# Patient Record
Sex: Male | Born: 1970 | Race: White | Hispanic: No | Marital: Single | Smoking: Current every day smoker
Health system: Southern US, Community
[De-identification: ages and names within clinical notes are randomized; demographics above are authoritative.]

---

## 2007-03-25 ENCOUNTER — Emergency Department (HOSPITAL_COMMUNITY): Admission: EM | Admit: 2007-03-25 | Discharge: 2007-03-26 | Payer: Self-pay | Admitting: Emergency Medicine

## 2012-08-28 ENCOUNTER — Encounter (HOSPITAL_COMMUNITY): Payer: Self-pay | Admitting: *Deleted

## 2012-08-28 ENCOUNTER — Emergency Department (HOSPITAL_COMMUNITY)
Admission: EM | Admit: 2012-08-28 | Discharge: 2012-08-28 | Disposition: A | Discharge status: Home / Self Care | Payer: Self-pay | Attending: Emergency Medicine | Admitting: Emergency Medicine

## 2012-08-28 DIAGNOSIS — K0889 Other specified disorders of teeth and supporting structures: Secondary | ICD-10-CM

## 2012-08-28 DIAGNOSIS — F17200 Nicotine dependence, unspecified, uncomplicated: Secondary | ICD-10-CM | POA: Insufficient documentation

## 2012-08-28 DIAGNOSIS — K029 Dental caries, unspecified: Secondary | ICD-10-CM

## 2012-08-28 DIAGNOSIS — K089 Disorder of teeth and supporting structures, unspecified: Secondary | ICD-10-CM | POA: Insufficient documentation

## 2012-08-28 MED ORDER — HYDROCODONE-ACETAMINOPHEN 5-325 MG PO TABS
2.0000 | ORAL_TABLET | Freq: Once | ORAL | Status: AC
  Administered 2012-08-28: 2 via ORAL
  Filled 2012-08-28: qty 2

## 2012-08-28 MED ORDER — PENICILLIN V POTASSIUM 500 MG PO TABS: 500.0000 mg | ORAL_TABLET | Freq: Three times a day (TID) | ORAL | Status: DC

## 2012-08-28 MED ORDER — HYDROCODONE-ACETAMINOPHEN 5-325 MG PO TABS: ORAL_TABLET | ORAL | Status: DC

## 2012-08-28 MED ORDER — PENICILLIN V POTASSIUM 250 MG PO TABS
500.0000 mg | ORAL_TABLET | Freq: Once | ORAL | Status: AC
  Administered 2012-08-28: 500 mg via ORAL
  Filled 2012-08-28: qty 2

## 2012-08-28 NOTE — Discharge Instructions (Signed)
 Dental Caries  Tooth decay (dental caries, cavities) is the most common of all oral diseases. It occurs in all ages but is more common in children and young adults.  CAUSES  Bacteria in your mouth combine with foods (particularly sugars and starches) to produce plaque. Plaque is a substance that sticks to the hard surfaces of teeth. The bacteria in the plaque produce acids that attack the enamel of teeth. Repeated acid attacks dissolve the enamel and create holes in the teeth. Root surfaces of teeth may also get these holes.  Other contributing factors include:   Frequent snacking and drinking of cavity-producing foods and liquids.  Poor oral hygiene.  Dry mouth.  Substance abuse such as methamphetamine.  Broken or poor fitting dental restorations.  Eating disorders.  Gastroesophageal reflux disease (GERD).  Certain radiation treatments to the head and neck. SYMPTOMS  At first, dental decay appears as white, chalky areas on the enamel. In this early stage, symptoms are seldom present. As the decay progresses, pits and holes may appear on the enamel surfaces. Progression of the decay will lead to softening of the hard layers of the tooth. At this point you may experience some pain or achy feeling after sweet, hot, or cold foods or drinks are consumed. If left untreated, the decay will reach the internal structures of the tooth and produce severe pain. Extensive dental treatment, such as root canal therapy, may be needed to save the tooth at this late stage of decay development.  DIAGNOSIS  Most cavities will be detected during regular check-ups. A thorough medical and dental history will be taken by the dentist. The dentist will use instruments to check the surfaces of your teeth for any breakdown or discoloration. Some dentists have special instruments, such as lasers, that detect tooth decay. Dental X-rays may also show some cavities that are not visible to the eye (such as between the  contact areas of the teeth). TREATMENT  Treatment involves removal of the tooth decay and replacement with a restorative material such as silver, gold, or composite (white) material. However, if the decay involves a large area of the tooth and there is little remaining healthy tooth structure, a cap (crown) will be fitted over the remaining structure. If the decay involves the center part of the tooth (pulp), root canal treatment will be needed before any type of dental restoration is placed. If the tooth is severely destroyed by the decay process, leaving the remaining tooth structures unrestorable, the tooth will need to be pulled (extracted). Some early tooth decay may be reversed by fluoride treatments and thorough brushing and flossing at home. PREVENTION   Eat healthy foods. Restrict the amount of sugary, starchy foods and liquids you consume. Avoid frequent snacking and drinking of unhealthy foods and liquids.  Sealants can help with prevention of cavities. Sealants are composite resins applied onto the biting surfaces of teeth at risk for decay. They smooth out the pits and grooves and prevent food from being trapped in them. This is done in early childhood before tooth decay has started.  Fluoride tablets may also be prescribed to children between 6 months and 32 years of age if your drinking water is not fluoridated. The fluoride absorbed by the tooth enamel makes teeth less susceptible to decay. Thorough daily cleaning with a toothbrush and dental floss is the best way to prevent cavities. Use of a fluoride toothpaste is highly recommended. Fluoride mouth rinses may be used in specific cases.  Topical application of  fluoride by your dentist is important in children.  Regular visits with a dentist for checkups and cleanings are also important. SEEK IMMEDIATE DENTAL CARE IF:  You have a fever.  You develop redness and swelling of your face, jaw, or neck.  You develop swelling around a  tooth.  You are unable to open your mouth or cannot swallow.  You have severe pain uncontrolled by pain medicine. Document Released: 06/02/2002 Document Revised: 12/03/2011 Document Reviewed: 02/15/2011 [facility] Patient Information 2013 [place], [place].    Narcotic and benzodiazepine use may cause drowsiness, slowed breathing or dependence.  Please use with caution and do not drive, operate machinery or watch young children alone while taking them.  Taking combinations of these medications or drinking alcohol will potentiate these effects.

## 2012-08-28 NOTE — ED Provider Notes (Signed)
History   This chart was scribed for Gavin Pound. Oletta Lamas, MD by Sofie Rower, ED Scribe. The patient was seen in room TR04C/TR04C and the patient's care was started at 3:16PM.     CSN: [id number]  Arrival date & time 08/28/12  1501   First MD Initiated Contact with Patient 08/28/12 1516      Chief Complaint  Patient presents with  . Dental Pain    (Consider location/radiation/quality/duration/timing/severity/associated sxs/prior treatment) The history is provided by the patient. No language interpreter was used.    Todd Carter is a 41 y.o. male , who presents to the Emergency Department complaining of  gradual, progressively worsening, dental pain, located at the right upper jaw, onset two days ago (08/26/12). The pt reports he has experienced minor dental discomfort for the past two months, however, his dental pain has increased severely over the past two days, prompting his visit to [facility] today. The pt has applied Orajel which does not provide relief of the dental pain.  History reviewed. No pertinent past medical history.  History reviewed. No pertinent past surgical history.  No family history on file.  History  Substance Use Topics  . Smoking status: Current Every Day Smoker  . Smokeless tobacco: Not on file  . Alcohol Use: Yes      Review of Systems  Constitutional: Negative for fever.  HENT: Positive for dental problem. Negative for ear pain and trouble swallowing.     Allergies  Review of patient's allergies indicates no known allergies.  Home Medications   Current Outpatient Rx  Name  Route  Sig  Dispense  Refill  . BENZOCAINE 10 % MT GEL   Mouth/Throat   Use as directed 1 application in the mouth or throat as needed. As needed for toothache.         . IBUPROFEN 200 MG PO TABS   Oral   Take 800 mg by mouth every 4 (four) hours as needed. As needed for toothache.         Marland Kitchen HYDROCODONE-ACETAMINOPHEN 5-325 MG PO TABS      1-2 tablets po q 6 hours prn  moderate to severe pain   20 tablet   0   . PENICILLIN V POTASSIUM 500 MG PO TABS   Oral   Take 1 tablet (500 mg total) by mouth 3 (three) times daily.   30 tablet   0     BP 111/80  Pulse 66  Temp 98 F (36.7 C) (Oral)  Resp 16  Ht 5\' 8"  (1.727 m)  Wt 150 lb (68.04 kg)  BMI 22.81 kg/m2  SpO2 98%  Physical Exam  Nursing note and vitals reviewed. Constitutional: He appears well-developed and well-nourished. No distress.  HENT:  Head: Atraumatic.  Nose: Nose normal.       2nd upper molar on right partially crack, few caries noted.   Neck: Normal range of motion.  Pulmonary/Chest: Effort normal.  Neurological: He is alert.  Skin: Skin is warm and dry. He is not diaphoretic.    ED Course  Procedures (including critical care time)  DIAGNOSTIC STUDIES: Oxygen Saturation is 98% on room air, normal by my interpretation.    COORDINATION OF CARE:   3:19 PM- Treatment plan concerning pain management, antibiotics and follow up with dentist discussed with patient. Pt agrees with treatment.     Labs Reviewed - No data to display No results found.   1. Dental caries   2. Pain, dental  No trismus, airway compromise.  Pt given oral analgesics and PCN.  Referral given to Dr. Leanord Asal.  Instructions on quick referral and to decrease smoking encouragement given.    MDM  I personally performed the services described in this documentation, which was scribed in my presence. The recorded information has been reviewed and is accurate.     Gavin Pound. Jarious Lyon, MD 08/28/12 1526

## 2012-08-28 NOTE — ED Notes (Signed)
The pt has had a toothache for weeks worse for 48 houra

## 2012-08-28 NOTE — ED Notes (Signed)
Pt c/o dental pain to right upper back tooth. Pt reports he hasn't been to see a dentist.

## 2012-11-17 ENCOUNTER — Emergency Department (HOSPITAL_COMMUNITY)
Admission: EM | Admit: 2012-11-17 | Discharge: 2012-11-17 | Disposition: A | Discharge status: Home / Self Care | Payer: Self-pay | Attending: Emergency Medicine | Admitting: Emergency Medicine

## 2012-11-17 ENCOUNTER — Emergency Department (HOSPITAL_COMMUNITY): Payer: Self-pay

## 2012-11-17 ENCOUNTER — Encounter (HOSPITAL_COMMUNITY): Payer: Self-pay | Admitting: *Deleted

## 2012-11-17 DIAGNOSIS — F17200 Nicotine dependence, unspecified, uncomplicated: Secondary | ICD-10-CM | POA: Insufficient documentation

## 2012-11-17 DIAGNOSIS — N509 Disorder of male genital organs, unspecified: Secondary | ICD-10-CM | POA: Insufficient documentation

## 2012-11-17 DIAGNOSIS — N453 Epididymo-orchitis: Secondary | ICD-10-CM | POA: Insufficient documentation

## 2012-11-17 DIAGNOSIS — N451 Epididymitis: Secondary | ICD-10-CM

## 2012-11-17 LAB — URINALYSIS, ROUTINE W REFLEX MICROSCOPIC
Glucose, UA: NEGATIVE mg/dL
Hgb urine dipstick: NEGATIVE
Leukocytes, UA: NEGATIVE
Specific Gravity, Urine: 1.025 (ref 1.005–1.030)
pH: 6 (ref 5.0–8.0)

## 2012-11-17 LAB — CBC WITH DIFFERENTIAL/PLATELET
Eosinophils Relative: 2 % (ref 0–5)
Lymphocytes Relative: 13 % (ref 12–46)
Lymphs Abs: 1.4 10*3/uL (ref 0.7–4.0)
MCV: 87.9 fL (ref 78.0–100.0)
Neutro Abs: 8.2 10*3/uL — ABNORMAL HIGH (ref 1.7–7.7)
Platelets: 198 10*3/uL (ref 150–400)
RBC: 4.48 MIL/uL (ref 4.22–5.81)
WBC: 10.6 10*3/uL — ABNORMAL HIGH (ref 4.0–10.5)

## 2012-11-17 LAB — BASIC METABOLIC PANEL
CO2: 25 mEq/L (ref 19–32)
Calcium: 9.6 mg/dL (ref 8.4–10.5)
Chloride: 101 mEq/L (ref 96–112)
Glucose, Bld: 99 mg/dL (ref 70–99)
Potassium: 4 mEq/L (ref 3.5–5.1)
Sodium: 136 mEq/L (ref 135–145)

## 2012-11-17 MED ORDER — OXYCODONE-ACETAMINOPHEN 5-325 MG PO TABS
2.0000 | ORAL_TABLET | Freq: Once | ORAL | Status: AC
  Administered 2012-11-17: 2 via ORAL
  Filled 2012-11-17: qty 2

## 2012-11-17 MED ORDER — OXYCODONE-ACETAMINOPHEN 5-325 MG PO TABS: 2.0000 | ORAL_TABLET | ORAL | Status: DC | PRN

## 2012-11-17 MED ORDER — CIPROFLOXACIN HCL 500 MG PO TABS: 500.0000 mg | ORAL_TABLET | Freq: Two times a day (BID) | ORAL | Status: DC

## 2012-11-17 NOTE — ED Notes (Signed)
Pt states right testicle swelling since yesterday

## 2012-11-17 NOTE — ED Provider Notes (Signed)
History     CSN: [id number]  Arrival date & time 11/17/12  1048   First MD Initiated Contact with Patient 11/17/12 1222      Chief Complaint  Patient presents with  . Groin Swelling    (Consider location/radiation/quality/duration/timing/severity/associated sxs/prior treatment) The history is provided by the patient.   patient here with right scrotal pain x24 hours. Pain worse with walking and characterized as sharp. Denies any dysuria or hematuria. Prior history of same. Denies any prior history of trauma Using Motrin without relief.  History reviewed. No pertinent past medical history.  History reviewed. No pertinent past surgical history.  No family history on file.  History  Substance Use Topics  . Smoking status: Current Every Day Smoker  . Smokeless tobacco: Not on file  . Alcohol Use: Yes      Review of Systems  All other systems reviewed and are negative.    Allergies  Review of patient's allergies indicates no known allergies.  Home Medications   Current Outpatient Rx  Name  Route  Sig  Dispense  Refill  . ibuprofen (ADVIL,MOTRIN) 200 MG tablet   Oral   Take 800 mg by mouth every 4 (four) hours as needed. As needed for toothache.           BP 119/71  Pulse 91  Temp(Src) 98.2 F (36.8 C) (Oral)  Resp 18  SpO2 98%  Physical Exam  Nursing note and vitals reviewed. Constitutional: He is oriented to person, place, and time. He appears well-developed and well-nourished.  Non-toxic appearance. No distress.  HENT:  Head: Normocephalic and atraumatic.  Eyes: Conjunctivae, EOM and lids are normal. Pupils are equal, round, and reactive to light.  Neck: Normal range of motion. Neck supple. No tracheal deviation present. No mass present.  Cardiovascular: Normal rate, regular rhythm and normal heart sounds.  Exam reveals no gallop.   No murmur heard. Pulmonary/Chest: Effort normal and breath sounds normal. No stridor. No respiratory distress. He has no  decreased breath sounds. He has no wheezes. He has no rhonchi. He has no rales.  Abdominal: Soft. Normal appearance and bowel sounds are normal. He exhibits no distension. There is no tenderness. There is no rebound and no CVA tenderness.  Genitourinary: Right testis shows swelling and tenderness. Circumcised.  Musculoskeletal: Normal range of motion. He exhibits no edema and no tenderness.  Neurological: He is alert and oriented to person, place, and time. He has normal strength. No cranial nerve deficit or sensory deficit. GCS eye subscore is 4. GCS verbal subscore is 5. GCS motor subscore is 6.  Skin: Skin is warm and dry. No abrasion and no rash noted.  Psychiatric: He has a normal mood and affect. His speech is normal and behavior is normal.    ED Course  Procedures (including critical care time)  Labs Reviewed  CBC WITH DIFFERENTIAL - Abnormal; Notable for the following:    WBC 10.6 (*)    Neutro Abs 8.2 (*)    All other components within normal limits  URINE CULTURE  BASIC METABOLIC PANEL  URINALYSIS, ROUTINE W REFLEX MICROSCOPIC   No results found.   No diagnosis found.    MDM  Pt to be treated for epididymitis        Toy Baker, MD 11/17/12 1421

## 2012-11-17 NOTE — ED Notes (Signed)
Pt reports right lower abdominal pain and lower back pain

## 2012-11-18 LAB — URINE CULTURE
Colony Count: NO GROWTH
Culture: NO GROWTH

## 2013-08-24 ENCOUNTER — Emergency Department (HOSPITAL_COMMUNITY): Payer: Self-pay

## 2013-08-24 ENCOUNTER — Emergency Department (HOSPITAL_COMMUNITY)
Admission: EM | Admit: 2013-08-24 | Discharge: 2013-08-24 | Disposition: A | Discharge status: Home / Self Care | Payer: Self-pay | Attending: Emergency Medicine | Admitting: Emergency Medicine

## 2013-08-24 ENCOUNTER — Encounter (HOSPITAL_COMMUNITY): Payer: Self-pay | Admitting: Emergency Medicine

## 2013-08-24 DIAGNOSIS — Y9389 Activity, other specified: Secondary | ICD-10-CM | POA: Insufficient documentation

## 2013-08-24 DIAGNOSIS — S43101A Unspecified dislocation of right acromioclavicular joint, initial encounter: Secondary | ICD-10-CM

## 2013-08-24 DIAGNOSIS — S43109A Unspecified dislocation of unspecified acromioclavicular joint, initial encounter: Secondary | ICD-10-CM | POA: Insufficient documentation

## 2013-08-24 DIAGNOSIS — F17200 Nicotine dependence, unspecified, uncomplicated: Secondary | ICD-10-CM | POA: Insufficient documentation

## 2013-08-24 DIAGNOSIS — W138XXA Fall from, out of or through other building or structure, initial encounter: Secondary | ICD-10-CM | POA: Insufficient documentation

## 2013-08-24 DIAGNOSIS — S298XXA Other specified injuries of thorax, initial encounter: Secondary | ICD-10-CM | POA: Insufficient documentation

## 2013-08-24 DIAGNOSIS — Y92009 Unspecified place in unspecified non-institutional (private) residence as the place of occurrence of the external cause: Secondary | ICD-10-CM | POA: Insufficient documentation

## 2013-08-24 MED ORDER — HYDROCODONE-ACETAMINOPHEN 5-325 MG PO TABS
2.0000 | ORAL_TABLET | Freq: Once | ORAL | Status: AC
  Administered 2013-08-24: 2 via ORAL
  Filled 2013-08-24: qty 2

## 2013-08-24 MED ORDER — OXYCODONE-ACETAMINOPHEN 5-325 MG PO TABS: 2.0000 | ORAL_TABLET | ORAL | Status: DC | PRN

## 2013-08-24 NOTE — ED Provider Notes (Signed)
CSN: [id number]     Arrival date & time 08/24/13  1034 History   First MD Initiated Contact with Patient 08/24/13 1535     Chief Complaint  Patient presents with  . Fall    HPI  Is a gentleman that was working on the roof of a house. He was on his feet. However he was crouched down leaning forward near the covering as he was working on.   he was wearing a hammer on a toolbelt. As he went to lean guttering. He fell landing on his right shoulder and right upper chest. He did not strike his head he. He did not lose consciousness. He has pain in his right shoulder. Now several hours later he is evolving more pain in his right ribs. He states his hand feels "numb. He can move it from the elbow to the wrist become shorter than a great deal of pain. He states he feels "like my bone is sticking out".  History reviewed. No pertinent past medical history. No past surgical history on file. No family history on file. History  Substance Use Topics  . Smoking status: Current Every Day Smoker  . Smokeless tobacco: Not on file  . Alcohol Use: Yes    Review of Systems  Constitutional: Negative for fever, chills, diaphoresis, appetite change and fatigue.  HENT: Negative for mouth sores, sore throat and trouble swallowing.   Eyes: Negative for visual disturbance.  Respiratory: Negative for cough, chest tightness, shortness of breath and wheezing.   Cardiovascular: Positive for chest pain.  Gastrointestinal: Negative for nausea, vomiting, abdominal pain, diarrhea and abdominal distention.  Endocrine: Negative for polydipsia, polyphagia and polyuria.  Genitourinary: Negative for dysuria, frequency and hematuria.  Musculoskeletal: Positive for arthralgias, joint swelling and myalgias. Negative for gait problem, neck pain and neck stiffness.  Skin: Negative for color change, pallor and rash.  Neurological: Positive for numbness. Negative for dizziness, syncope, light-headedness and headaches.   Hematological: Does not bruise/bleed easily.  Psychiatric/Behavioral: Negative for behavioral problems and confusion.    Allergies  Review of patient's allergies indicates no known allergies.  Home Medications  No current outpatient prescriptions on file. BP 117/68  Pulse 83  Temp(Src) 99.4 F (37.4 C) (Oral)  Resp 19  Wt 155 lb (70.308 kg)  SpO2 96% Physical Exam  Constitutional: He is oriented to person, place, and time. He appears well-developed and well-nourished. No distress.  HENT:  Head: Normocephalic.  No sign of trauma to the head.  Eyes: Conjunctivae are normal. Pupils are equal, round, and reactive to light. No scleral icterus.  Neck: Normal range of motion. Neck supple. No thyromegaly present.  He has no reproducible midline neck pain. He has a distracting injury with a a.c. separation and some right rib pain.  He will not Shrug the shoulder, secondary to pain  Cardiovascular: Normal rate and regular rhythm.  Exam reveals no gallop and no friction rub.   No murmur heard. Pulmonary/Chest: Effort normal and breath sounds normal. No respiratory distress. He has no wheezes. He has no rales.  Tenderness in the right chest wall. No decreased breath sounds. No crackles or rales. No palpable subcutaneous air. No bony crepitus.  Abdominal: Soft. Bowel sounds are normal. He exhibits no distension. There is no tenderness. There is no rebound.  Abdomen soft and benign. He has reproducible tenderness in the right upper abdomen or the right flank.  Musculoskeletal: Normal range of motion.  Neurological: He is alert and oriented to person, place, and  time.  He has normal sensation throughout the bilateral upper extremities. He has normal sensation above and below the clavicle. He has normal use and movement of his lower extremities has normal sensation to his lower extremities. He will not shrug the shoulder or elevate the upper arm. However he will pronate and supinate the forearm and  has normal grip strength in either direction the fingers and the right hand. No neurological loss noted to either upper extremity  Skin: Skin is warm and dry. No rash noted.  Psychiatric: He has a normal mood and affect. His behavior is normal.    ED Course  Procedures (including critical care time) Labs Review Labs Reviewed - No data to display Imaging Review Dg Ribs Unilateral W/chest Right  08/24/2013   CLINICAL DATA:  Pain post trauma  EXAM: RIGHT RIBS AND CHEST - 3+ VIEW  COMPARISON:  None.  FINDINGS: Frontal chest as well as bilateral oblique and cone-down lower rib images were obtained. Lungs are clear. Heart size and pulmonary vascularity are normal. No adenopathy.  No effusion or pneumothorax.  No demonstrable rib fracture.  IMPRESSION: No edema or consolidation.  No demonstrable rib fracture.   Electronically Signed   By: Bretta Bang M.D.   On: 08/24/2013 17:01   Dg Thoracic Spine 2 View  08/24/2013   CLINICAL DATA:  Larey Seat 10 feet.  EXAM: THORACIC SPINE - 2 VIEW  COMPARISON:  None.  FINDINGS: There is no evidence of thoracic spine fracture. Alignment is normal. No other significant bone abnormalities are identified.  IMPRESSION: Negative.   Electronically Signed   By: Elige Ko   On: 08/24/2013 17:00   Dg Shoulder Right  08/24/2013   CLINICAL DATA:  Pain status post fall, unable to tolerate the axillary view  EXAM: RIGHT SHOULDER - 2+ VIEW  COMPARISON:  None.  FINDINGS: There is disruption of the [facility] joint. The glenohumeral joint appears normal. There is no evidence of rib fracture nor a fracture of the visualized portions of the proximal humerus nor of the clavicle nor of the acromion or coracoid. As best as can be determined the body of the scapula is intact. The observed portions of the right ribs appear normal. No pneumothorax is evident.  IMPRESSION: There is disruption of the AC joint. No definite acute fracture is demonstrated. The glenohumeral joint does not appear  dislocated.   Electronically Signed   By: David  [place]   On: 08/24/2013 11:14   Ct Cervical Spine Wo Contrast  08/24/2013   CLINICAL DATA:  Fall from roof. Right shoulder pain. Right hand numbness.  EXAM: CT CERVICAL SPINE WITHOUT CONTRAST  TECHNIQUE: Multidetector CT imaging of the cervical spine was performed without intravenous contrast. Multiplanar CT image reconstructions were also generated.  COMPARISON:  None.  FINDINGS: The cervical spine is imaged from the skullbase through T3-4. Mild degenerative changes are evident at C3-4 and at C4-5. Uncovertebral spurring is present bilaterally with mild foraminal narrowing, right greater than left. No other significant stenosis is evident in the cervical spine. The soft tissues are unremarkable. The thyroid is within normal limits. The lung apices are clear.  IMPRESSION: 1. Mild degenerative changes in the cervical spine are most evident at C3-4 and C4-5. 2. No acute fracture or traumatic subluxation.   Electronically Signed   By: Gennette Pac M.D.   On: 08/24/2013 16:23    EKG Interpretation   None       MDM   1. AC separation, right, initial  encounter    CT cervical spine shows no acute abnormalities. Thoracic spine plain films of acute release. Rib series a chest x-ray shows no pneumothorax, no pulmonary contusion, no rib abnormalities. He has a high-grade a.c. separation of the right. He has been with a sling. Discharged home. Orthopedic followup. Nomogram the right upper extremity to follow. Percocet prescription.    Roney Marion, MD 08/24/13 [phone]

## 2013-08-24 NOTE — ED Notes (Signed)
Pt has not sat down since this RN has been at nurse first at 1100.  Pt pacing and in NAD.  VSS

## 2013-08-24 NOTE — ED Notes (Addendum)
Fell ~ 12 feet of a roof about 1 hour ago and landed ion rt shoulder and rt side hurts to breath and  Rt hand feels numb , rt arm has good radial pulse and pt can wiggle fingers fingers and had are warm to touch having back pain also

## 2014-05-21 ENCOUNTER — Emergency Department (HOSPITAL_COMMUNITY)
Admission: EM | Admit: 2014-05-21 | Discharge: 2014-05-21 | Disposition: A | Discharge status: Home / Self Care | Payer: Self-pay | Attending: Emergency Medicine | Admitting: Emergency Medicine

## 2014-05-21 ENCOUNTER — Encounter (HOSPITAL_COMMUNITY): Payer: Self-pay | Admitting: Emergency Medicine

## 2014-05-21 ENCOUNTER — Emergency Department (HOSPITAL_COMMUNITY): Payer: Self-pay

## 2014-05-21 DIAGNOSIS — Y9289 Other specified places as the place of occurrence of the external cause: Secondary | ICD-10-CM | POA: Insufficient documentation

## 2014-05-21 DIAGNOSIS — S8253XA Displaced fracture of medial malleolus of unspecified tibia, initial encounter for closed fracture: Secondary | ICD-10-CM | POA: Insufficient documentation

## 2014-05-21 DIAGNOSIS — F17200 Nicotine dependence, unspecified, uncomplicated: Secondary | ICD-10-CM | POA: Insufficient documentation

## 2014-05-21 DIAGNOSIS — S8251XA Displaced fracture of medial malleolus of right tibia, initial encounter for closed fracture: Secondary | ICD-10-CM

## 2014-05-21 DIAGNOSIS — S8990XA Unspecified injury of unspecified lower leg, initial encounter: Secondary | ICD-10-CM | POA: Insufficient documentation

## 2014-05-21 DIAGNOSIS — W11XXXA Fall on and from ladder, initial encounter: Secondary | ICD-10-CM | POA: Insufficient documentation

## 2014-05-21 DIAGNOSIS — S99919A Unspecified injury of unspecified ankle, initial encounter: Secondary | ICD-10-CM

## 2014-05-21 DIAGNOSIS — S99929A Unspecified injury of unspecified foot, initial encounter: Secondary | ICD-10-CM

## 2014-05-21 DIAGNOSIS — Y9389 Activity, other specified: Secondary | ICD-10-CM | POA: Insufficient documentation

## 2014-05-21 MED ORDER — IBUPROFEN 800 MG PO TABS: 800.0000 mg | ORAL_TABLET | Freq: Three times a day (TID) | ORAL | Status: AC

## 2014-05-21 MED ORDER — HYDROCODONE-ACETAMINOPHEN 5-325 MG PO TABS: 1.0000 | ORAL_TABLET | ORAL | Status: AC | PRN

## 2014-05-21 NOTE — ED Notes (Signed)
Ortho tech at bedside 

## 2014-05-21 NOTE — Progress Notes (Signed)
  CARE MANAGEMENT ED NOTE 05/21/2014  Patient:  Todd Carter, Todd Carter   Account Number:  [id number]  Date Initiated:  05/21/2014  Documentation initiated by:  Edd Arbour  Subjective/Objective Assessment:   43 yr old self pay [employer] pt he fell off a roof and rolled his R ankle. Pt is also having pain to his R hip and R elbow. No LOC noted. Swelling and bruising noted Imaging= acute minimally distracted fracture of the medial malleolus     Subjective/Objective Assessment Detail:   Pt agrees to receive resources for [employer] and [place]     Action/Plan:   Spoke with pt and male at bedside about self pay resources for [place]  and [employer]   Action/Plan Detail:   Anticipated DC Date:  05/21/2014     Status Recommendation to Physician:   Result of Recommendation:    Other ED Services  Consult Working Plan    DC Planning Services  Other  PCP issues  Outpatient Services - Pt will follow up    Choice offered to / List presented to:            Status of service:  Completed, signed off  ED Comments:   ED Comments Detail:

## 2014-05-21 NOTE — Discharge Instructions (Signed)
Keep ankle elevated. Ice. Crutches. Ibuprofen and norco for pain. Call today to get an orthopedics appointment next week as referred.    Ankle Fracture A fracture is a break in a bone. The ankle joint is made up of three bones. These include the lower (distal)sections of your lower leg bones, called the tibia and fibula, along with a bone in your foot, called the talus. Depending on how bad the break is and if more than one ankle joint bone is broken, a cast or splint is used to protect and keep your injured bone from moving while it heals. Sometimes, surgery is required to help the fracture heal properly.  There are two general types of fractures:  Stable fracture. This includes a single fracture line through one bone, with no injury to ankle ligaments. A fracture of the talus that does not have any displacement (movement of the bone on either side of the fracture line) is also stable.  Unstable fracture. This includes more than one fracture line through one or more bones in the ankle joint. It also includes fractures that have displacement of the bone on either side of the fracture line. CAUSES  A direct blow to the ankle.   Quickly and severely twisting your ankle.  Trauma, such as a car accident or falling from a significant height. RISK FACTORS You may be at a higher risk of ankle fracture if:  You have certain medical conditions.  You are involved in high-impact sports.  You are involved in a high-impact car accident. SIGNS AND SYMPTOMS   Tender and swollen ankle.  Bruising around the injured ankle.  Pain on movement of the ankle.  Difficulty walking or putting weight on the ankle.  A cold foot below the site of the ankle injury. This can occur if the blood vessels passing through your injured ankle were also damaged.  Numbness in the foot below the site of the ankle injury. DIAGNOSIS  An ankle fracture is usually diagnosed with a physical exam and X-rays. A CT scan may  also be required for complex fractures. TREATMENT  Stable fractures are treated with a cast or splint and using crutches to avoid putting weight on your injured ankle. This is followed by an ankle strengthening program. Some patients require a special type of cast, depending on other medical problems they may have. Unstable fractures require surgery to ensure the bones heal properly. Your health care provider will tell you what type of fracture you have and the best treatment for your condition. HOME CARE INSTRUCTIONS   Review correct crutch use with your health care provider and use your crutches as directed. Safe use of crutches is extremely important. Misuse of crutches can cause you to fall or cause injury to nerves in your hands or armpits.  Do not put weight or pressure on the injured ankle until directed by your health care provider.  To lessen the swelling, keep the injured leg elevated while sitting or lying down.  Apply ice to the injured area:  Put ice in a plastic bag.  Place a towel between your cast and the bag.  Leave the ice on for 20 minutes, 2-3 times a day.  If you have a plaster or fiberglass cast:  Do not try to scratch the skin under the cast with any objects. This can increase your risk of skin infection.  Check the skin around the cast every day. You may put lotion on any red or sore areas.  Keep  your cast dry and clean.  If you have a plaster splint:  Wear the splint as directed.  You may loosen the elastic around the splint if your toes become numb, tingle, or turn cold or blue.  Do not put pressure on any part of your cast or splint; it may break. Rest your cast only on a pillow the first 24 hours until it is fully hardened.  Your cast or splint can be protected during bathing with a plastic bag sealed to your skin with medical tape. Do not lower the cast or splint into water.  Take medicines as directed by your health care provider. Only take  over-the-counter or prescription medicines for pain, discomfort, or fever as directed by your health care provider.  Do not drive a vehicle until your health care provider specifically tells you it is safe to do so.  If your health care provider has given you a follow-up appointment, it is very important to keep that appointment. Not keeping the appointment could result in a chronic or permanent injury, pain, and disability. If you have any problem keeping the appointment, call the facility for assistance. SEEK MEDICAL CARE IF: You develop increased swelling or discomfort. SEEK IMMEDIATE MEDICAL CARE IF:   Your cast gets damaged or breaks.  You have continued severe pain.  You develop new pain or swelling after the cast was put on.  Your skin or toenails below the injury turn blue or gray.  Your skin or toenails below the injury feel cold, numb, or have loss of sensitivity to touch.  There is a bad smell or pus draining from under the cast. MAKE SURE YOU:   Understand these instructions.  Will watch your condition.  Will get help right away if you are not doing well or get worse. Document Released: 09/07/2000 Document Revised: 09/15/2013 Document Reviewed: 04/09/2013 [facility] Patient Information 2015 [place], [place]. This information is not intended to replace advice given to you by your health care provider. Make sure you discuss any questions you have with your health care provider.

## 2014-05-21 NOTE — ED Provider Notes (Signed)
CSN: [id number]     Arrival date & time 05/21/14  1037 History   First MD Initiated Contact with Patient 05/21/14 1102     Chief Complaint  Patient presents with  . Ankle Pain     (Consider location/radiation/quality/duration/timing/severity/associated sxs/prior Treatment) HPI PHI Todd Carter is a 43 y.o. male who presents to ED with complaint of right ankle injury. Pt states he fell off of a ladder yesterday afternoon. States his right foot landed on a nail gun that was on the ground and his foot everted. Pt reports swelling, bruising, pain to the medial right ankle. Pain with movement. States tried to walk on it but it was too painful. He applied an Ace wrap and kept it elevated. He did not take anything for pain. He does not have an orthopedic specialist. He denies any pain to his other ankle or foot. He denies any pain in his lower back. No other major injuries. He did not hit his head or lost consciousness.  History reviewed. No pertinent past medical history. History reviewed. No pertinent past surgical history. History reviewed. No pertinent family history. History  Substance Use Topics  . Smoking status: Current Every Day Smoker  . Smokeless tobacco: Not on file  . Alcohol Use: Yes    Review of Systems  Musculoskeletal: Positive for arthralgias, gait problem and joint swelling. Negative for back pain and neck pain.  Skin: Negative for wound.  Neurological: Negative for weakness and numbness.      Allergies  Review of patient's allergies indicates no known allergies.  Home Medications   Prior to Admission medications   Not on File   BP 126/79  Temp(Src) 97.9 F (36.6 C) (Oral)  Resp 20  SpO2 100% Physical Exam  Nursing note and vitals reviewed. Constitutional: He is oriented to person, place, and time. He appears well-developed and well-nourished. No distress.  Neck: Normal range of motion. Neck supple.  Musculoskeletal:  Significant bruising and swelling to  the right medical ankle. Tenderness over medial malleolus. Pain with any ROM. dp pulses intact. Calcaneus non tender. Achilles tendon intact. Normal foot and toes. Normal right knee. Cap refill <2sec distally.   Neurological: He is alert and oriented to person, place, and time.  Skin: Skin is warm and dry.    ED Course  Procedures (including critical care time) Labs Review Labs Reviewed - No data to display  Imaging Review Dg Ankle Complete Right  05/21/2014   CLINICAL DATA:  Status post fall with twisting injury  EXAM: RIGHT ANKLE - COMPLETE 3+ VIEW  COMPARISON:  Right ankle March 25, 2007  FINDINGS: There is an acute horizontally oriented minimally distracted medial malleolar fracture. The ankle joint mortise is preserved. The lateral and posterior malleoli are intact. The talar dome and remainder of the talus are intact. The calcaneus is normal. The visualized metatarsal bases are normal.  IMPRESSION: There is an acute minimally distracted fracture of the medial malleolus.   Electronically Signed   By: David  [place]   On: 05/21/2014 11:05     EKG Interpretation None      MDM   Final diagnoses:  Fractured medial malleolus, right, closed, initial encounter    Pt with medial malleolar fracture. Closed. Joint mortise preserved. Neurovascularly intact. Stirrup- posterior splint applied by an ortho tech. Pt has his own crutches. Home with ibuprofen and norco and follow up with orthopedics.   Filed Vitals:   05/21/14 1042  BP: 126/79  Temp: 97.9 F (36.6 C)  Resp: 20       Lottie Mussel, PA-C 05/21/14 1146
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
## 2014-05-21 NOTE — ED Notes (Signed)
Knows not to take extra tylenol and not to drink alcohol, drive, operate heavy machinery with prescribed meds. AVS explained in detail.+

## 2014-05-25 NOTE — ED Provider Notes (Signed)
Medical screening examination/treatment/procedure(s) were performed by non-physician practitioner and as supervising physician I was immediately available for consultation/collaboration.   EKG Interpretation None        Fatuma Dowers David Evgenia Merriman III, MD 05/25/14 1706 

## 2016-08-23 ENCOUNTER — Emergency Department (HOSPITAL_COMMUNITY): Payer: Self-pay

## 2016-08-23 ENCOUNTER — Emergency Department (HOSPITAL_COMMUNITY)
Admission: EM | Admit: 2016-08-23 | Discharge: 2016-08-23 | Disposition: A | Discharge status: Home / Self Care | Payer: Self-pay | Attending: Emergency Medicine | Admitting: Emergency Medicine

## 2016-08-23 ENCOUNTER — Encounter (HOSPITAL_COMMUNITY): Payer: Self-pay | Admitting: Vascular Surgery

## 2016-08-23 DIAGNOSIS — S161XXA Strain of muscle, fascia and tendon at neck level, initial encounter: Secondary | ICD-10-CM | POA: Insufficient documentation

## 2016-08-23 DIAGNOSIS — Y929 Unspecified place or not applicable: Secondary | ICD-10-CM | POA: Insufficient documentation

## 2016-08-23 DIAGNOSIS — R0789 Other chest pain: Secondary | ICD-10-CM | POA: Insufficient documentation

## 2016-08-23 DIAGNOSIS — Y939 Activity, unspecified: Secondary | ICD-10-CM | POA: Insufficient documentation

## 2016-08-23 DIAGNOSIS — F17210 Nicotine dependence, cigarettes, uncomplicated: Secondary | ICD-10-CM | POA: Insufficient documentation

## 2016-08-23 DIAGNOSIS — Y999 Unspecified external cause status: Secondary | ICD-10-CM | POA: Insufficient documentation

## 2016-08-23 MED ORDER — DIAZEPAM 5 MG PO TABS
5.0000 mg | ORAL_TABLET | Freq: Once | ORAL | Status: AC
  Administered 2016-08-23: 5 mg via ORAL
  Filled 2016-08-23: qty 1

## 2016-08-23 MED ORDER — KETOROLAC TROMETHAMINE 60 MG/2ML IM SOLN
60.0000 mg | Freq: Once | INTRAMUSCULAR | Status: AC
Start: 2016-08-23 — End: 2016-08-23
  Administered 2016-08-23: 60 mg via INTRAMUSCULAR
  Filled 2016-08-23: qty 2

## 2016-08-23 MED ORDER — METHOCARBAMOL 750 MG PO TABS: 750.0000 mg | ORAL_TABLET | Freq: Four times a day (QID) | ORAL | 0 refills | Status: AC

## 2016-08-23 MED ORDER — NAPROXEN 500 MG PO TABS: 500.0000 mg | ORAL_TABLET | Freq: Two times a day (BID) | ORAL | 0 refills | Status: AC
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
## 2016-08-23 NOTE — ED Notes (Signed)
Declined W/C at D/C and was escorted to lobby by RN. 
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
